# Patient Record
Sex: Female | Born: 1970 | Race: White | Hispanic: No | Marital: Married
Health system: Southern US, Community
[De-identification: ages and names within clinical notes are randomized; demographics above are authoritative.]

---

## 2007-10-17 ENCOUNTER — Ambulatory Visit: Payer: Self-pay | Admitting: Family Medicine

## 2007-10-17 DIAGNOSIS — D509 Iron deficiency anemia, unspecified: Secondary | ICD-10-CM

## 2007-10-17 DIAGNOSIS — G43109 Migraine with aura, not intractable, without status migrainosus: Secondary | ICD-10-CM | POA: Insufficient documentation

## 2007-10-17 DIAGNOSIS — Q674 Other congenital deformities of skull, face and jaw: Secondary | ICD-10-CM

## 2007-10-17 DIAGNOSIS — K8020 Calculus of gallbladder without cholecystitis without obstruction: Secondary | ICD-10-CM | POA: Insufficient documentation

## 2007-10-17 DIAGNOSIS — F329 Major depressive disorder, single episode, unspecified: Secondary | ICD-10-CM

## 2007-10-17 LAB — CONVERTED CEMR LAB
Glucose, Urine, Semiquant: NEGATIVE
Protein, U semiquant: NEGATIVE
pH: 6

## 2007-10-18 ENCOUNTER — Ambulatory Visit: Payer: Self-pay | Admitting: Family Medicine

## 2007-10-21 LAB — CONVERTED CEMR LAB
ALT: 13 units/L (ref 0–35)
AST: 18 units/L (ref 0–37)
Basophils Relative: 0.3 % (ref 0.0–1.0)
Bilirubin, Direct: 0.2 mg/dL (ref 0.0–0.3)
CO2: 27 meq/L (ref 19–32)
Calcium: 9 mg/dL (ref 8.4–10.5)
Chloride: 105 meq/L (ref 96–112)
Eosinophils Relative: 2.7 % (ref 0.0–5.0)
Glucose, Bld: 87 mg/dL (ref 70–99)
HCT: 37.5 % (ref 36.0–46.0)
Lymphocytes Relative: 31.2 % (ref 12.0–46.0)
Neutro Abs: 2.4 10*3/uL (ref 1.4–7.7)
Platelets: 169 10*3/uL (ref 150–400)
RBC: 4.12 M/uL (ref 3.87–5.11)
Total Protein: 7.1 g/dL (ref 6.0–8.3)
Triglycerides: 56 mg/dL (ref 0–149)
VLDL: 11 mg/dL (ref 0–40)
WBC: 4.1 10*3/uL — ABNORMAL LOW (ref 4.5–10.5)

## 2007-10-24 LAB — CONVERTED CEMR LAB: Anticardiolipin IgM: <7 (ref <10)

## 2007-11-07 ENCOUNTER — Encounter: Payer: Self-pay | Admitting: Family Medicine

## 2007-12-09 ENCOUNTER — Encounter: Payer: Self-pay | Admitting: Family Medicine

## 2007-12-23 ENCOUNTER — Telehealth: Payer: Self-pay | Admitting: Family Medicine

## 2007-12-24 ENCOUNTER — Ambulatory Visit: Payer: Self-pay | Admitting: Internal Medicine

## 2007-12-24 LAB — CONVERTED CEMR LAB
Ketones, urine, test strip: NEGATIVE
Protein, U semiquant: 30
Specific Gravity, Urine: 1.025
pH: 6.5

## 2007-12-27 ENCOUNTER — Ambulatory Visit: Payer: Self-pay | Admitting: Internal Medicine

## 2007-12-27 ENCOUNTER — Telehealth (INDEPENDENT_AMBULATORY_CARE_PROVIDER_SITE_OTHER): Payer: Self-pay | Admitting: Internal Medicine

## 2007-12-27 LAB — CONVERTED CEMR LAB
Bacteria, UA: 0
Bilirubin Urine: NEGATIVE
Protein, U semiquant: NEGATIVE
Specific Gravity, Urine: <1.005
Urobilinogen, UA: NEGATIVE

## 2007-12-28 ENCOUNTER — Encounter (INDEPENDENT_AMBULATORY_CARE_PROVIDER_SITE_OTHER): Payer: Self-pay | Admitting: Internal Medicine

## 2007-12-28 ENCOUNTER — Encounter: Payer: Self-pay | Admitting: Family Medicine

## 2008-01-08 ENCOUNTER — Encounter: Payer: Self-pay | Admitting: Family Medicine

## 2009-02-23 ENCOUNTER — Ambulatory Visit: Payer: Self-pay | Admitting: Family Medicine

## 2009-02-23 DIAGNOSIS — F411 Generalized anxiety disorder: Secondary | ICD-10-CM | POA: Insufficient documentation

## 2009-02-25 ENCOUNTER — Encounter: Payer: Self-pay | Admitting: Family Medicine

## 2009-02-25 ENCOUNTER — Ambulatory Visit: Payer: Self-pay | Admitting: Psychiatry

## 2009-03-01 ENCOUNTER — Ambulatory Visit: Payer: Self-pay | Admitting: Psychiatry

## 2009-03-08 ENCOUNTER — Ambulatory Visit: Payer: Self-pay | Admitting: Psychiatry

## 2009-03-10 ENCOUNTER — Ambulatory Visit: Payer: Self-pay | Admitting: Family Medicine

## 2009-03-16 ENCOUNTER — Telehealth: Payer: Self-pay | Admitting: Family Medicine

## 2009-03-18 ENCOUNTER — Ambulatory Visit: Payer: Self-pay | Admitting: Psychiatry

## 2009-03-30 ENCOUNTER — Ambulatory Visit: Payer: Self-pay | Admitting: Psychiatry

## 2009-04-07 ENCOUNTER — Ambulatory Visit: Payer: Self-pay | Admitting: Psychiatry

## 2009-04-12 ENCOUNTER — Ambulatory Visit: Payer: Self-pay | Admitting: Family Medicine

## 2009-04-15 ENCOUNTER — Ambulatory Visit: Payer: Self-pay | Admitting: Psychiatry

## 2009-04-21 ENCOUNTER — Ambulatory Visit: Payer: Self-pay | Admitting: Psychiatry

## 2009-04-29 ENCOUNTER — Ambulatory Visit: Payer: Self-pay | Admitting: Psychiatry

## 2009-05-10 ENCOUNTER — Ambulatory Visit: Payer: Self-pay | Admitting: Psychiatry

## 2009-05-19 ENCOUNTER — Ambulatory Visit: Payer: Self-pay | Admitting: Psychiatry

## 2009-06-01 ENCOUNTER — Ambulatory Visit: Payer: Self-pay | Admitting: Psychiatry

## 2009-06-15 ENCOUNTER — Ambulatory Visit: Payer: Self-pay | Admitting: Psychiatry

## 2009-06-18 ENCOUNTER — Encounter: Payer: Self-pay | Admitting: Family Medicine

## 2009-06-18 ENCOUNTER — Ambulatory Visit: Payer: Self-pay | Admitting: Family Medicine

## 2009-06-18 ENCOUNTER — Other Ambulatory Visit: Admission: RE | Admit: 2009-06-18 | Discharge: 2009-06-18 | Payer: Self-pay | Admitting: Family Medicine

## 2009-06-18 DIAGNOSIS — M069 Rheumatoid arthritis, unspecified: Secondary | ICD-10-CM | POA: Insufficient documentation

## 2009-06-22 LAB — CONVERTED CEMR LAB: Hep B C IgM: NEGATIVE

## 2009-06-23 ENCOUNTER — Ambulatory Visit: Payer: Self-pay | Admitting: Psychiatry

## 2009-06-25 ENCOUNTER — Encounter (INDEPENDENT_AMBULATORY_CARE_PROVIDER_SITE_OTHER): Payer: Self-pay | Admitting: *Deleted

## 2009-06-30 ENCOUNTER — Ambulatory Visit: Payer: Self-pay | Admitting: Psychiatry

## 2009-07-05 ENCOUNTER — Ambulatory Visit: Payer: Self-pay | Admitting: Family Medicine

## 2009-07-05 DIAGNOSIS — R300 Dysuria: Secondary | ICD-10-CM | POA: Insufficient documentation

## 2009-07-05 LAB — CONVERTED CEMR LAB
Ketones, urine, test strip: NEGATIVE
Nitrite: NEGATIVE
Specific Gravity, Urine: <1.005
WBC Urine, dipstick: NEGATIVE
pH: 7.5

## 2009-07-06 ENCOUNTER — Encounter: Payer: Self-pay | Admitting: Family Medicine

## 2009-07-06 ENCOUNTER — Ambulatory Visit: Payer: Self-pay | Admitting: Psychiatry

## 2009-07-20 ENCOUNTER — Ambulatory Visit: Payer: Self-pay | Admitting: Psychiatry

## 2009-08-02 ENCOUNTER — Encounter: Payer: Self-pay | Admitting: Family Medicine

## 2009-08-02 ENCOUNTER — Ambulatory Visit: Payer: Self-pay | Admitting: Psychiatry

## 2009-08-03 ENCOUNTER — Ambulatory Visit: Payer: Self-pay | Admitting: Family Medicine

## 2009-08-10 ENCOUNTER — Ambulatory Visit: Payer: Self-pay | Admitting: Psychiatry

## 2009-08-12 ENCOUNTER — Ambulatory Visit (HOSPITAL_COMMUNITY): Admission: RE | Admit: 2009-08-12 | Discharge: 2009-08-12 | Payer: Self-pay | Admitting: Rheumatology

## 2009-08-23 ENCOUNTER — Ambulatory Visit: Payer: Self-pay | Admitting: Psychiatry

## 2009-08-27 ENCOUNTER — Encounter: Payer: Self-pay | Admitting: Family Medicine

## 2009-08-30 ENCOUNTER — Emergency Department (HOSPITAL_COMMUNITY): Admission: EM | Admit: 2009-08-30 | Discharge: 2009-08-30 | Payer: Self-pay | Admitting: Emergency Medicine

## 2009-08-30 ENCOUNTER — Ambulatory Visit: Payer: Self-pay | Admitting: Psychiatry

## 2009-09-13 ENCOUNTER — Ambulatory Visit: Payer: Self-pay | Admitting: Psychiatry

## 2009-09-20 ENCOUNTER — Ambulatory Visit: Payer: Self-pay | Admitting: Psychiatry

## 2009-09-27 ENCOUNTER — Ambulatory Visit: Payer: Self-pay | Admitting: Psychiatry

## 2009-10-11 ENCOUNTER — Ambulatory Visit: Payer: Self-pay | Admitting: Psychiatry

## 2009-10-18 ENCOUNTER — Ambulatory Visit: Payer: Self-pay | Admitting: Psychiatry

## 2009-10-25 ENCOUNTER — Ambulatory Visit: Payer: Self-pay | Admitting: Psychiatry

## 2009-11-01 ENCOUNTER — Ambulatory Visit: Payer: Self-pay | Admitting: Psychiatry

## 2009-11-15 ENCOUNTER — Ambulatory Visit: Payer: Self-pay | Admitting: Psychiatry

## 2009-12-02 ENCOUNTER — Encounter: Payer: Self-pay | Admitting: Family Medicine

## 2009-12-06 ENCOUNTER — Ambulatory Visit: Payer: Self-pay | Admitting: Psychiatry

## 2009-12-13 ENCOUNTER — Ambulatory Visit: Payer: Self-pay | Admitting: Psychiatry

## 2009-12-20 ENCOUNTER — Ambulatory Visit: Payer: Self-pay | Admitting: Psychiatry

## 2009-12-27 ENCOUNTER — Ambulatory Visit: Payer: Self-pay | Admitting: Psychiatry

## 2010-01-03 ENCOUNTER — Ambulatory Visit: Payer: Self-pay | Admitting: Psychiatry

## 2010-01-10 ENCOUNTER — Ambulatory Visit: Payer: Self-pay | Admitting: Psychiatry

## 2010-01-24 ENCOUNTER — Ambulatory Visit: Payer: Self-pay | Admitting: Psychiatry

## 2010-02-14 ENCOUNTER — Ambulatory Visit: Payer: Self-pay | Admitting: Psychiatry

## 2010-02-21 ENCOUNTER — Ambulatory Visit: Payer: Self-pay | Admitting: Psychiatry

## 2010-02-28 ENCOUNTER — Ambulatory Visit: Payer: Self-pay | Admitting: Psychiatry

## 2010-03-07 ENCOUNTER — Ambulatory Visit: Payer: Self-pay | Admitting: Psychiatry

## 2010-03-21 ENCOUNTER — Ambulatory Visit: Payer: Self-pay | Admitting: Psychiatry

## 2010-03-25 ENCOUNTER — Encounter: Payer: Self-pay | Admitting: Family Medicine

## 2010-03-28 ENCOUNTER — Ambulatory Visit: Payer: Self-pay | Admitting: Psychiatry

## 2010-04-04 ENCOUNTER — Ambulatory Visit: Payer: Self-pay | Admitting: Psychiatry

## 2010-04-11 ENCOUNTER — Ambulatory Visit: Payer: Self-pay | Admitting: Psychiatry

## 2010-04-25 ENCOUNTER — Ambulatory Visit: Payer: Self-pay | Admitting: Psychiatry

## 2010-05-02 ENCOUNTER — Ambulatory Visit: Payer: Self-pay | Admitting: Psychiatry

## 2010-05-09 ENCOUNTER — Ambulatory Visit: Payer: Self-pay | Admitting: Psychiatry

## 2010-05-23 ENCOUNTER — Ambulatory Visit: Payer: Self-pay | Admitting: Psychiatry

## 2010-05-30 ENCOUNTER — Ambulatory Visit: Payer: Self-pay | Admitting: Psychiatry

## 2010-06-06 ENCOUNTER — Ambulatory Visit: Payer: Self-pay | Admitting: Psychiatry

## 2010-06-13 ENCOUNTER — Ambulatory Visit: Payer: Self-pay | Admitting: Psychiatry

## 2010-06-27 ENCOUNTER — Ambulatory Visit: Payer: Self-pay | Admitting: Psychiatry

## 2010-07-18 ENCOUNTER — Ambulatory Visit: Payer: Self-pay | Admitting: Psychiatry

## 2010-07-25 ENCOUNTER — Ambulatory Visit: Payer: Self-pay | Admitting: Psychiatry

## 2010-08-01 ENCOUNTER — Ambulatory Visit: Payer: Self-pay | Admitting: Psychiatry

## 2010-08-05 ENCOUNTER — Emergency Department (HOSPITAL_COMMUNITY): Admission: EM | Admit: 2010-08-05 | Discharge: 2010-08-05 | Payer: Self-pay | Admitting: Emergency Medicine

## 2010-08-08 ENCOUNTER — Ambulatory Visit: Payer: Self-pay | Admitting: Psychiatry

## 2010-08-22 ENCOUNTER — Ambulatory Visit: Payer: Self-pay | Admitting: Psychiatry

## 2010-08-29 ENCOUNTER — Ambulatory Visit: Payer: Self-pay | Admitting: Psychiatry

## 2010-09-19 ENCOUNTER — Ambulatory Visit: Admit: 2010-09-19 | Payer: Self-pay | Admitting: Psychiatry

## 2010-09-23 ENCOUNTER — Encounter: Payer: Self-pay | Admitting: Family Medicine

## 2010-09-26 ENCOUNTER — Ambulatory Visit
Admission: RE | Admit: 2010-09-26 | Discharge: 2010-09-26 | Payer: Self-pay | Source: Home / Self Care | Attending: Psychiatry | Admitting: Psychiatry

## 2010-10-03 ENCOUNTER — Ambulatory Visit: Admit: 2010-10-03 | Payer: Self-pay | Admitting: Psychiatry

## 2010-10-10 ENCOUNTER — Ambulatory Visit: Admit: 2010-10-10 | Payer: Self-pay | Admitting: Psychiatry

## 2010-10-11 NOTE — Letter (Signed)
Summary: [facility]  [facility]   Imported By: Lanelle Bal 12/11/2009 09:51:07  _____________________________________________________________________  External Attachment:    Type:   Image     Comment:   External Document

## 2010-10-11 NOTE — Letter (Signed)
Summary: [facility]  [facility]   Imported By: Lanelle Bal 04/04/2010 12:00:00  _____________________________________________________________________  External Attachment:    Type:   Image     Comment:   External Document

## 2010-10-13 NOTE — Letter (Signed)
Summary: [facility]   [facility]   Imported By: Maryln Gottron 10/03/2010 14:16:39  _____________________________________________________________________  External Attachment:    Type:   Image     Comment:   External Document

## 2010-10-17 ENCOUNTER — Ambulatory Visit: Payer: Self-pay | Admitting: Psychiatry

## 2010-11-22 LAB — URINALYSIS, ROUTINE W REFLEX MICROSCOPIC
Bilirubin Urine: NEGATIVE
Glucose, UA: NEGATIVE mg/dL
Hgb urine dipstick: NEGATIVE
Ketones, ur: NEGATIVE mg/dL
Protein, ur: NEGATIVE mg/dL

## 2010-11-22 LAB — URINE MICROSCOPIC-ADD ON

## 2010-11-22 LAB — WET PREP, GENITAL
Trich, Wet Prep: NONE SEEN
Yeast Wet Prep HPF POC: NONE SEEN

## 2010-12-12 LAB — URINALYSIS, ROUTINE W REFLEX MICROSCOPIC
Glucose, UA: NEGATIVE mg/dL
Leukocytes, UA: NEGATIVE
Nitrite: POSITIVE — AB
Protein, ur: NEGATIVE mg/dL
pH: 6.5 (ref 5.0–8.0)

## 2010-12-12 LAB — GLUCOSE, CAPILLARY

## 2010-12-12 LAB — POCT I-STAT, CHEM 8
Calcium, Ion: 1.14 mmol/L (ref 1.12–1.32)
Chloride: 105 mEq/L (ref 96–112)
Creatinine, Ser: 0.6 mg/dL (ref 0.4–1.2)
Glucose, Bld: 123 mg/dL — ABNORMAL HIGH (ref 70–99)
HCT: 39 % (ref 36.0–46.0)

## 2010-12-12 LAB — URINE MICROSCOPIC-ADD ON

## 2012-03-26 ENCOUNTER — Telehealth: Payer: Self-pay

## 2012-03-26 NOTE — Telephone Encounter (Signed)
Pt left v/m does not live in area now but request call back to sort out complicated matter. Left v/m for pt to call back.

## 2012-03-27 NOTE — Telephone Encounter (Signed)
Left vm for pt to callback 

## 2012-04-22 NOTE — Telephone Encounter (Signed)
I have not been able to reach pt by phone; sending letter to pt.

## 2016-04-08 ENCOUNTER — Emergency Department (HOSPITAL_COMMUNITY)
Admission: EM | Admit: 2016-04-08 | Discharge: 2016-04-08 | Disposition: A | Discharge status: Home / Self Care | Payer: BLUE CROSS/BLUE SHIELD | Attending: Emergency Medicine | Admitting: Emergency Medicine

## 2016-04-08 ENCOUNTER — Emergency Department (HOSPITAL_COMMUNITY): Payer: BLUE CROSS/BLUE SHIELD

## 2016-04-08 ENCOUNTER — Encounter: Payer: Self-pay | Admitting: Emergency Medicine

## 2016-04-08 DIAGNOSIS — R109 Unspecified abdominal pain: Secondary | ICD-10-CM | POA: Insufficient documentation

## 2016-04-08 DIAGNOSIS — K529 Noninfective gastroenteritis and colitis, unspecified: Secondary | ICD-10-CM

## 2016-04-08 DIAGNOSIS — R140 Abdominal distension (gaseous): Secondary | ICD-10-CM | POA: Diagnosis present

## 2016-04-08 LAB — COMPREHENSIVE METABOLIC PANEL
ALBUMIN: 4.2 g/dL (ref 3.5–5.0)
ALK PHOS: 40 U/L (ref 38–126)
ALT: 17 U/L (ref 14–54)
AST: 30 U/L (ref 15–41)
Anion gap: 6 (ref 5–15)
BUN: 7 mg/dL (ref 6–20)
CALCIUM: 9.1 mg/dL (ref 8.9–10.3)
CO2: 25 mmol/L (ref 22–32)
CREATININE: 0.64 mg/dL (ref 0.44–1.00)
Chloride: 104 mmol/L (ref 101–111)
GFR calc Af Amer: >60 mL/min (ref >60)
GFR calc non Af Amer: >60 mL/min (ref >60)
GLUCOSE: 86 mg/dL (ref 65–99)
Potassium: 4.9 mmol/L (ref 3.5–5.1)
SODIUM: 135 mmol/L (ref 135–145)
Total Bilirubin: 0.9 mg/dL (ref 0.3–1.2)
Total Protein: 6.9 g/dL (ref 6.5–8.1)

## 2016-04-08 LAB — URINALYSIS, ROUTINE W REFLEX MICROSCOPIC
Bilirubin Urine: NEGATIVE
GLUCOSE, UA: NEGATIVE mg/dL
Ketones, ur: 15 mg/dL — AB
Leukocytes, UA: NEGATIVE
Nitrite: NEGATIVE
Protein, ur: NEGATIVE mg/dL
SPECIFIC GRAVITY, URINE: 1.02 (ref 1.005–1.030)
pH: 5.5 (ref 5.0–8.0)

## 2016-04-08 LAB — URINE MICROSCOPIC-ADD ON: SQUAMOUS EPITHELIAL / LPF: NONE SEEN

## 2016-04-08 LAB — I-STAT BETA HCG BLOOD, ED (MC, WL, AP ONLY): I-stat hCG, quantitative: <5 m[IU]/mL (ref <5)

## 2016-04-08 MED ORDER — IOPAMIDOL (ISOVUE-300) INJECTION 61%
INTRAVENOUS | Status: AC
  Administered 2016-04-08: 75 mL
  Filled 2016-04-08: qty 75

## 2016-04-08 MED ORDER — DIATRIZOATE MEGLUMINE & SODIUM 66-10 % PO SOLN
ORAL | Status: AC
  Filled 2016-04-08: qty 30

## 2016-04-08 NOTE — ED Notes (Signed)
Returned from CT.

## 2016-04-08 NOTE — Discharge Instructions (Signed)
Avoid milk or foods containing milk chest as cheese or ice cream all having diarrhea. Take Imodium as directed for diarrhea. Make sure that you stay well-hydrated. Drink at least six 8 ounce glasses of water each day. Call the [facility] and community wellness Center to get a primary care physician. The clinic has walk in hours on Thursdays and you can be seen next week if not feeling better. You can also call the women's outpatient clinic to get a gynecologist return if your condition worsens for any reason

## 2016-04-08 NOTE — ED Notes (Signed)
Patient transported to CT 

## 2016-04-08 NOTE — ED Provider Notes (Signed)
[facility]-EMERGENCY DEPT Provider Note   CSN: [id number] Arrival date & time: 04/08/16  7829  First Provider Contact:  First MD Initiated Contact with Patient 04/08/16 0912        History   Chief Complaint No chief complaint on file.   HPI Tracey Lucas is a 45 y.o. female.Complains of diffuse abdominal bloating onset 4 days ago, constant, mild, nothing makes symptoms better or worse. Admits to diminished appetite. Other associated symptoms include yellowish nonbloody diarrhea 3 episodes since yesterday. No treatment prior to coming here nothing makes symptoms better or worse. No fever. No vaginal discharge. Last normal menstrual period 3.5 weeks ago  HPI  No past medical history on file. Past medical history rheumatoid arthritis Patient Active Problem List   Diagnosis Date Noted  . DYSURIA 07/05/2009  . ARTHRITIS, RHEUMATOID 06/18/2009  . ANXIETY DISORDER, ACUTE 02/23/2009  . ANEMIA-IRON DEFICIENCY 10/17/2007  . DEPRESSION 10/17/2007  . MIGRAINE, CLASSICAL 10/17/2007  . CHOLELITHIASIS 10/17/2007  . 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 Surgical history lipoma removed from the latissimus dorsi OB History    No data available       Home Medications    Prior to Admission medications   Not on File    Family History No family history on file.  Social History Social History  Substance Use Topics  . Smoking status: Not on file  . Smokeless tobacco: Not on file  . Alcohol use Not on file  No tobacco rare alcohol no illicit drug use   Allergies   Review of patient's allergies indicates not on file.   Review of Systems Review of Systems  Constitutional: Negative.   HENT: Negative.   Respiratory: Negative.   Cardiovascular: Negative.   Gastrointestinal: Positive for abdominal distention and diarrhea.  Musculoskeletal: Negative.   Skin: Negative.   Neurological: Negative.   Psychiatric/Behavioral:  Negative.   All other systems reviewed and are negative.    Physical Exam Updated Vital Signs There were no vitals taken for this visit.  Physical Exam  Constitutional: She appears well-developed and well-nourished.  HENT:  Head: Normocephalic and atraumatic.  Eyes: Conjunctivae are normal. Pupils are equal, round, and reactive to light.  Neck: Neck supple. No tracheal deviation present. No thyromegaly present.  Cardiovascular: Normal rate and regular rhythm.   No murmur heard. Pulmonary/Chest: Effort normal and breath sounds normal.  Abdominal: Soft. Bowel sounds are normal. There is tenderness.  Mild diffuse tenderness  Musculoskeletal: Normal range of motion. She exhibits no edema or tenderness.  Neurological: She is alert. Coordination normal.  Skin: Skin is warm and dry. No rash noted.  Psychiatric: She has a normal mood and affect.  Nursing note and vitals reviewed.  2:25 PM patient resting comfortably. No distress. She reports she's had 3 more episodes of diarrhea since here. She denies any recent travel or antibiotic usage.  ED Treatments / Results  Labs (all labs ordered are listed, but only abnormal results are displayed) Labs Reviewed - No data to display  EKG  EKG Interpretation None       Radiology No results found.  Procedures Procedures (including critical care time)  Medications Ordered in ED Medications - No data to display  Results for orders placed or performed during the hospital encounter of 04/08/16  Comprehensive metabolic panel  Result Value Ref Range   Sodium 135 135 - 145 mmol/L   Potassium 4.9 3.5 - 5.1 mmol/L   Chloride 104  101 - 111 mmol/L   CO2 25 22 - 32 mmol/L   Glucose, Bld 86 65 - 99 mg/dL   BUN 7 6 - 20 mg/dL   Creatinine, Ser 1.61 0.44 - 1.00 mg/dL   Calcium 9.1 8.9 - 09.6 mg/dL   Total Protein 6.9 6.5 - 8.1 g/dL   Albumin 4.2 3.5 - 5.0 g/dL   AST 30 15 - 41 U/L   ALT 17 14 - 54 U/L   Alkaline Phosphatase 40 38 - 126  U/L   Total Bilirubin 0.9 0.3 - 1.2 mg/dL   GFR calc non Af Amer >60 >60 mL/min   GFR calc Af Amer >60 >60 mL/min   Anion gap 6 5 - 15  Urinalysis, Routine w reflex microscopic (not at [facility])  Result Value Ref Range   Color, Urine YELLOW YELLOW   APPearance HAZY (A) CLEAR   Specific Gravity, Urine 1.020 1.005 - 1.030   pH 5.5 5.0 - 8.0   Glucose, UA NEGATIVE NEGATIVE mg/dL   Hgb urine dipstick TRACE (A) NEGATIVE   Bilirubin Urine NEGATIVE NEGATIVE   Ketones, ur 15 (A) NEGATIVE mg/dL   Protein, ur NEGATIVE NEGATIVE mg/dL   Nitrite NEGATIVE NEGATIVE   Leukocytes, UA NEGATIVE NEGATIVE  Urine microscopic-add on  Result Value Ref Range   Squamous Epithelial / LPF NONE SEEN NONE SEEN   WBC, UA 0-5 0 - 5 WBC/hpf   RBC / HPF 0-5 0 - 5 RBC/hpf   Bacteria, UA MANY (A) NONE SEEN  I-Stat beta hCG blood, ED ([facility], [facility], [facility] only)  Result Value Ref Range   I-stat hCG, quantitative <5.0 <5 mIU/mL   Comment 3           Ct Abdomen Pelvis W Contrast  Result Date: 04/08/2016 CLINICAL DATA:  45 year old female with abdominal pain, swelling and nausea for 3 days. EXAM: CT ABDOMEN AND PELVIS WITH CONTRAST TECHNIQUE: Multidetector CT imaging of the abdomen and pelvis was performed using the standard protocol following bolus administration of intravenous contrast. CONTRAST:  75mL ISOVUE-300 IOPAMIDOL (ISOVUE-300) INJECTION 61% COMPARISON:  None. FINDINGS: Lower chest:  Unremarkable Hepatobiliary: The liver and gallbladder are unremarkable. There is no evidence of biliary dilatation. Pancreas: Unremarkable Spleen: Unremarkable Adrenals/Urinary Tract: The kidneys, adrenal glands and bladder are unremarkable except for a small left renal cyst. Stomach/Bowel: Unremarkable. There is no evidence of bowel obstruction or definite bowel wall thickening. The appendix is normal. Vascular/Lymphatic: Unremarkable. No abdominal aortic aneurysm or enlarged lymph nodes. Reproductive: An IUD is noted without other abnormality  noted. Other: No free fluid, abscess or pneumoperitoneum. Musculoskeletal: No acute or suspicious abnormality. IMPRESSION: No acute or significant abnormality. IUD. Electronically Signed   By: Harmon Pier M.D.   On: 04/08/2016 13:29  Initial Impression / Assessment and Plan / ED Course  I have reviewed the triage vital signs and the nursing notes.  Pertinent labs & imaging results that were available during my care of the patient were reviewed by me and considered in my medical decision making (see chart for details).  Clinical Course  Plan Imodium as needed for diarrhea. Avoid dairy. Referral  and wellness center to get primary care physician. We will also refer to women's outpatient clinic to get gynecologist as she reports that her IUD needs to come out after several years     Final Clinical Impressions(s) / ED Diagnoses  Diagnosis enteritis Final diagnoses:  None    New Prescriptions New Prescriptions   No medications on file  Doug Sou, MD 04/08/16 1435

## 2016-04-08 NOTE — ED Notes (Signed)
Pt up to RR to void. No difficulty noted,

## 2017-01-23 IMAGING — CT CT ABD-PELV W/ CM
2 of 5 series · 13 of 46 positions shown, 15 images · IV contrast (Iodine)
Comparison: None.

CLINICAL DATA: 44-year-old female with abdominal pain, swelling and
nausea for 3 days.

EXAM:
CT ABDOMEN AND PELVIS WITH CONTRAST
TECHNIQUE: Multidetector CT imaging of the abdomen and pelvis was performed
using the standard protocol following bolus administration of
intravenous contrast.
CONTRAST:  75mL HMZRZS-QYY IOPAMIDOL (HMZRZS-QYY) INJECTION 61%

[Series 201: routine, idose (2) · axial · 0.74mm/px · z∈[-29,+306]mm · 10 of 77 slices shown, 12 images]
[im 5/77  soft-tissue]
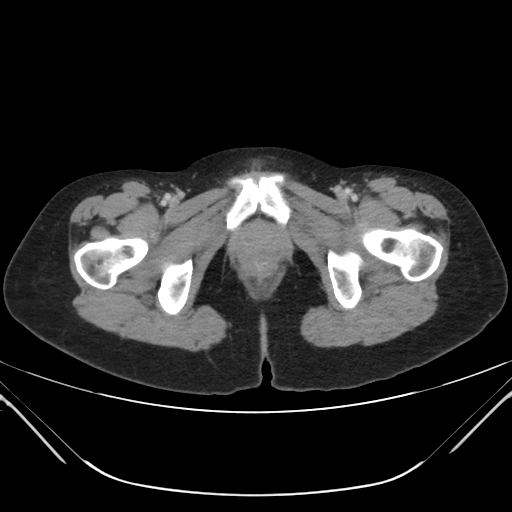
[im 5/77  bone]
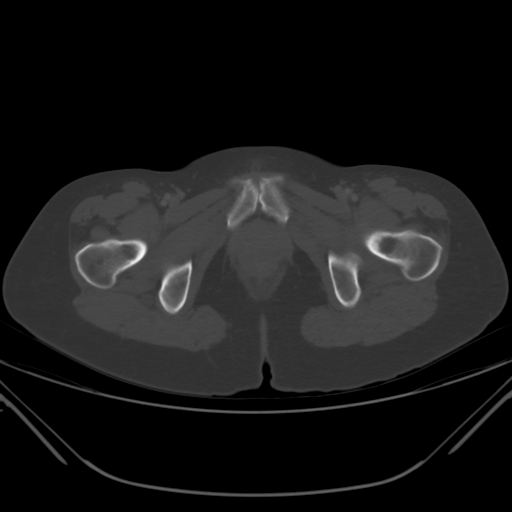
[im 14/77  soft-tissue]
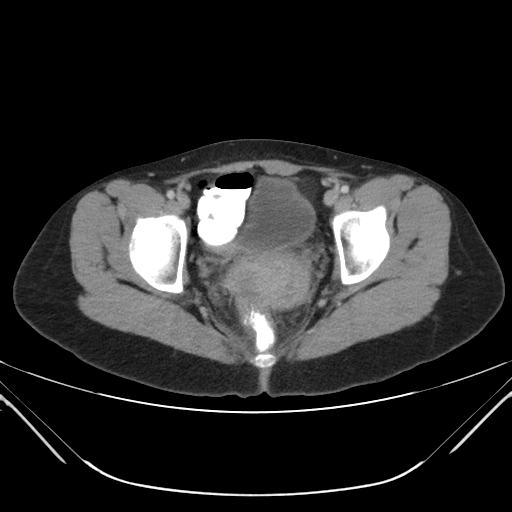
[im 23/77  soft-tissue]
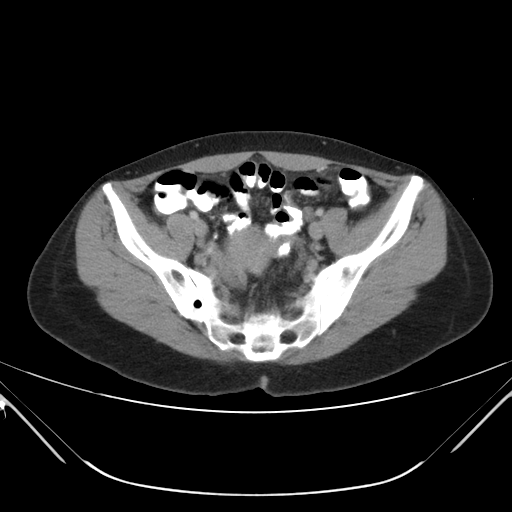
[im 27/77  soft-tissue]
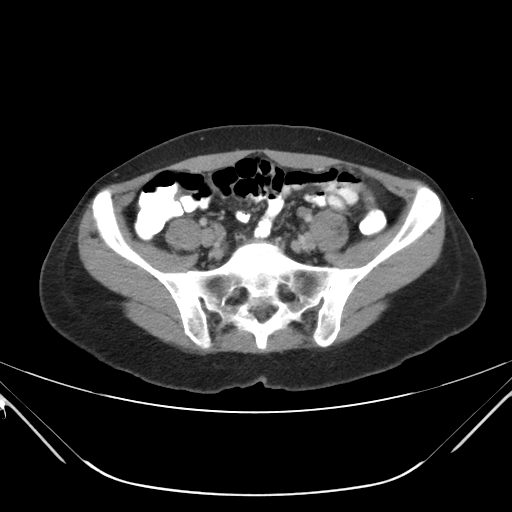
[im 36/77  soft-tissue]
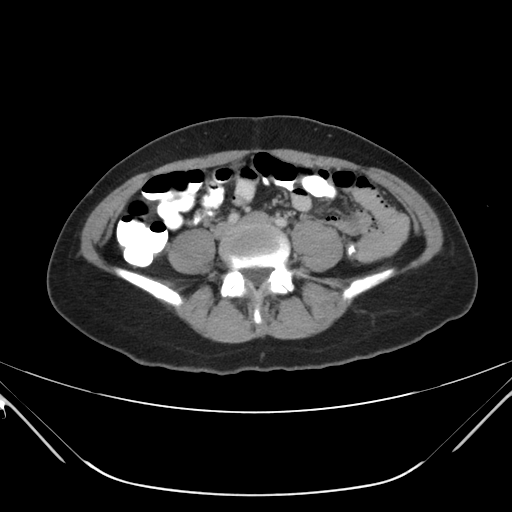
[im 41/77  soft-tissue]
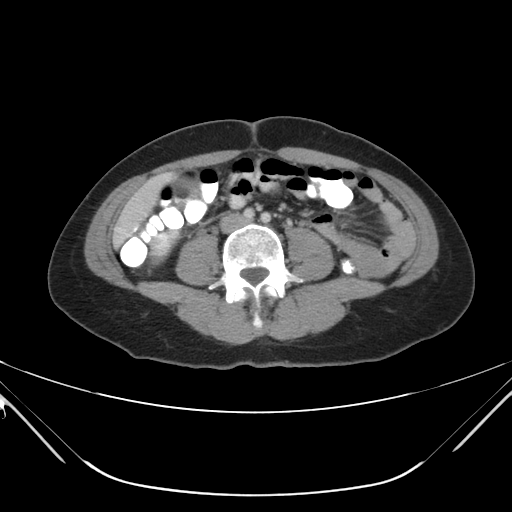
[im 50/77  soft-tissue]
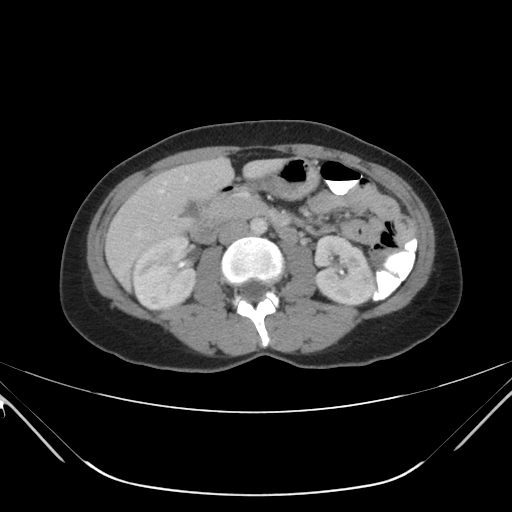
[im 59/77  soft-tissue]
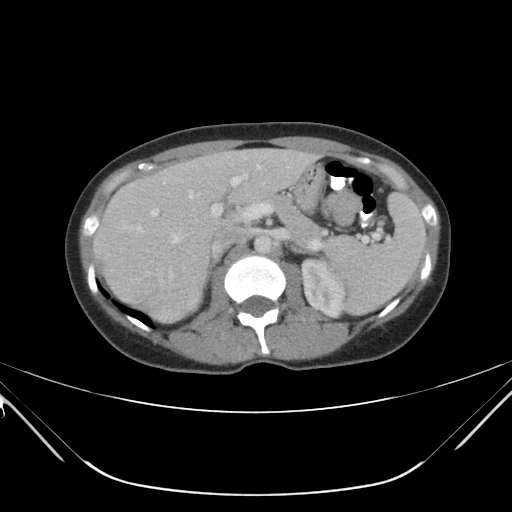
[im 63/77  soft-tissue]
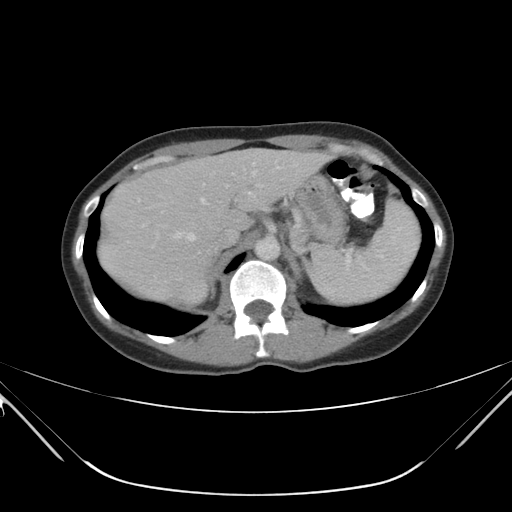
[im 63/77  bone]
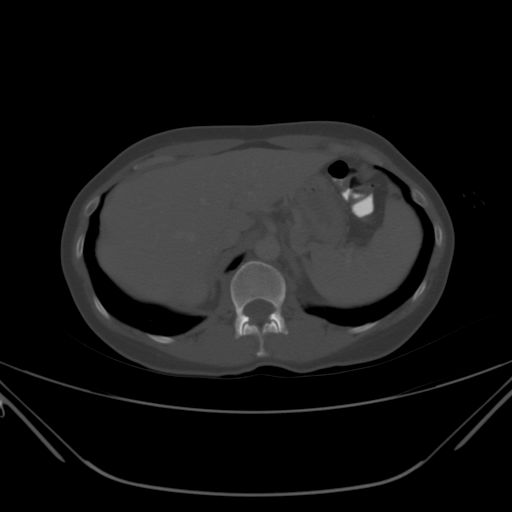
[im 72/77  soft-tissue]
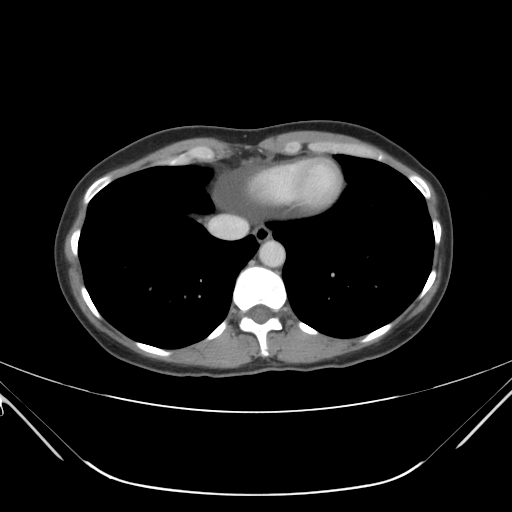

[Series 203: coronals, idose (2) · coronal · 0.45mm/px · 3 of 80 slices shown]
[im 27/80  soft-tissue]
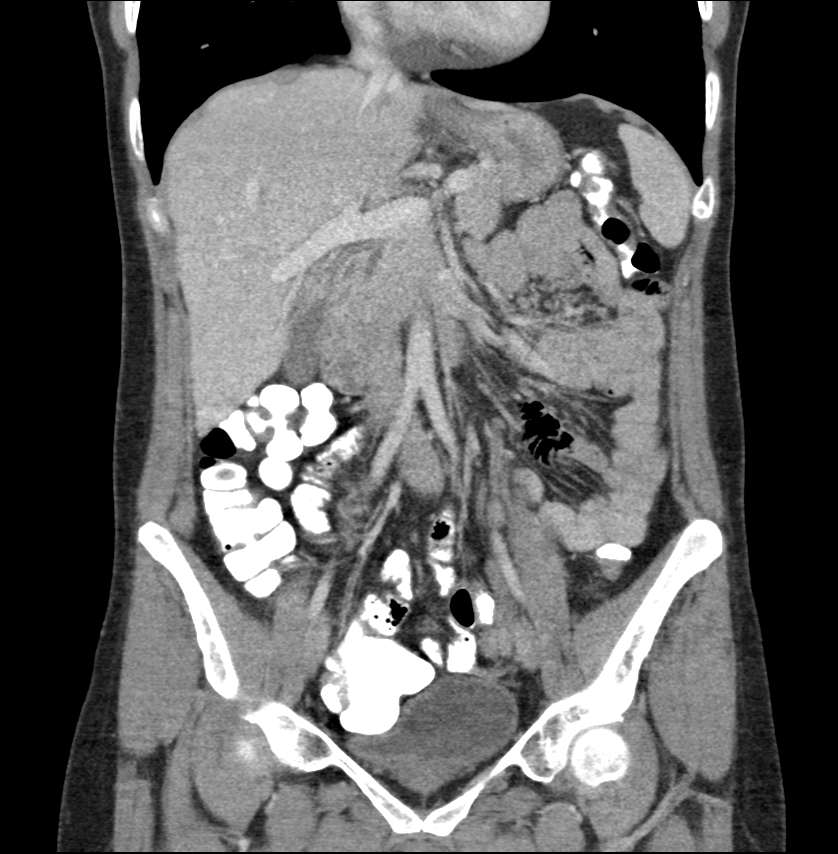
[im 36/80  soft-tissue]
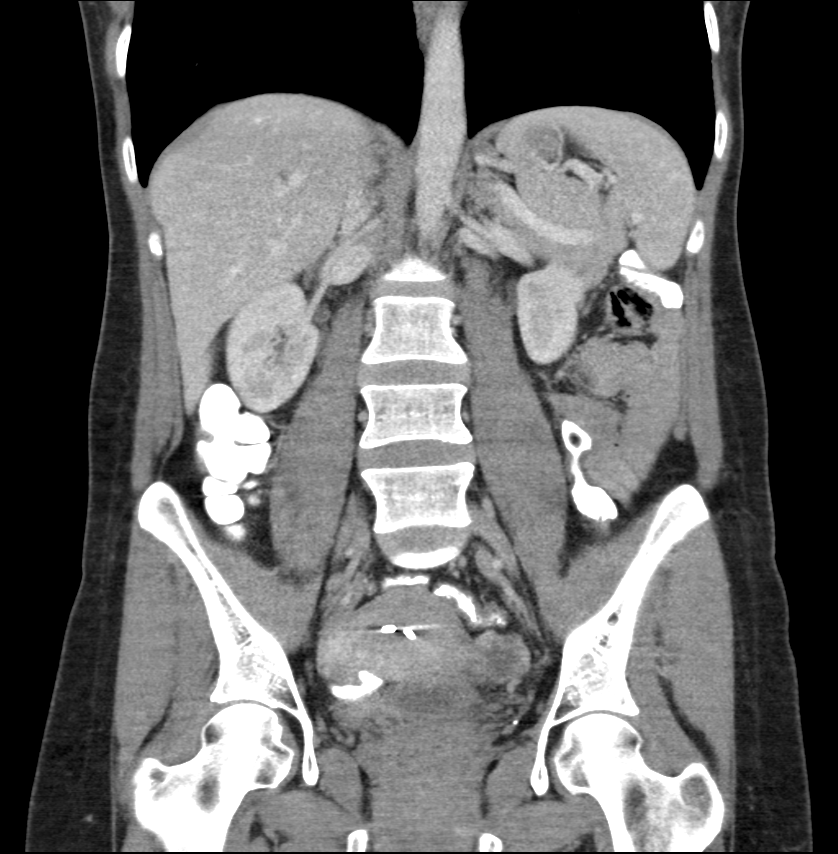
[im 44/80  soft-tissue]
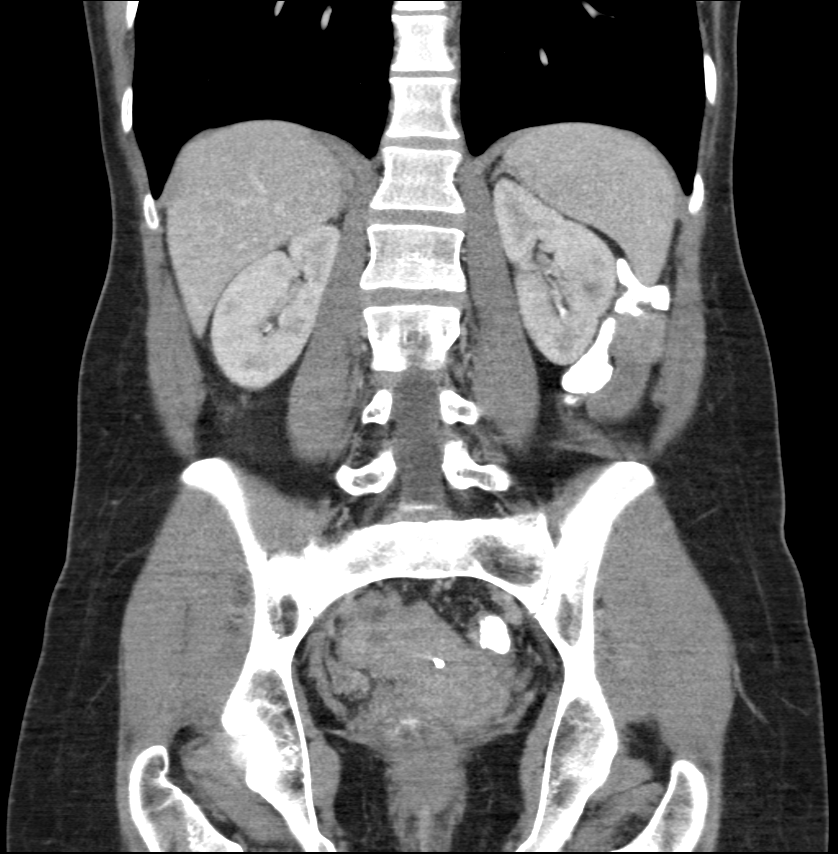

[13 of 46 positions shown; findings below may reference images not displayed]

FINDINGS: Lower chest:  Unremarkable

Hepatobiliary: The liver and gallbladder are unremarkable. There is
no evidence of biliary dilatation.

Pancreas: Unremarkable

Spleen: Unremarkable

Adrenals/Urinary Tract: The kidneys, adrenal glands and bladder are
unremarkable except for a small left renal cyst.

Stomach/Bowel: Unremarkable. There is no evidence of bowel
obstruction or definite bowel wall thickening. The appendix is
normal.

Vascular/Lymphatic: Unremarkable. No abdominal aortic aneurysm or
enlarged lymph nodes.

Reproductive: An IUD is noted without other abnormality noted.

Other: No free fluid, abscess or pneumoperitoneum.

Musculoskeletal: No acute or suspicious abnormality.
IMPRESSION: No acute or significant abnormality.

IUD.
